# Patient Record
Sex: Male | Born: 1985 | Race: Black or African American | Hispanic: No
Health system: Southern US, Community
[De-identification: ages and names within clinical notes are randomized; demographics above are authoritative.]

---
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
## 2019-12-26 ENCOUNTER — Ambulatory Visit: Payer: Self-pay | Attending: Internal Medicine

## 2019-12-26 ENCOUNTER — Ambulatory Visit: Payer: 59

## 2019-12-26 DIAGNOSIS — Z23 Encounter for immunization: Secondary | ICD-10-CM

## 2019-12-26 NOTE — Progress Notes (Signed)
   Covid-19 Vaccination Clinic  Name:  Brandon Roberson    MRN: [id number] DOB: May 03, 1986  12/26/2019  [name] was observed post Covid-19 immunization for 15 minutes without incident. He was provided with Vaccine Information Sheet and instruction to access the V-Safe system.   [name] was instructed to call 911 with any severe reactions post vaccine: Marland Kitchen Difficulty breathing  . Swelling of face and throat  . A fast heartbeat  . A bad rash all over body  . Dizziness and weakness   Immunizations Administered    Name Date Dose VIS Date Route   Moderna COVID-19 Vaccine 12/26/2019 12:25 PM 0.5 mL 09/02/2019 Intramuscular   Manufacturer: Moderna   Lot: [id number]   NDC: 70623-762-83

## 2020-01-27 ENCOUNTER — Ambulatory Visit: Payer: Self-pay

## 2020-01-30 ENCOUNTER — Ambulatory Visit: Payer: Self-pay | Attending: Internal Medicine

## 2020-01-30 DIAGNOSIS — Z23 Encounter for immunization: Secondary | ICD-10-CM

## 2020-01-30 NOTE — Progress Notes (Signed)
   Covid-19 Vaccination Clinic  Name:  Brandon Roberson    MRN: [id number] DOB: 01-30-1986  01/30/2020  Brandon Roberson was observed post Covid-19 immunization for 15 minutes without incident. He was provided with Vaccine Information Sheet and instruction to access the V-Safe system.   Brandon Roberson was instructed to call 911 with any severe reactions post vaccine: Marland Kitchen Difficulty breathing  . Swelling of face and throat  . A fast heartbeat  . A bad rash all over body  . Dizziness and weakness   Immunizations Administered    Name Date Dose VIS Date Route   Moderna COVID-19 Vaccine 01/30/2020  9:19 AM 0.5 mL 09/2019 Intramuscular   Manufacturer: Moderna   Lot: [id number]   NDC: 00349-611-64

## 2023-10-25 ENCOUNTER — Ambulatory Visit
Admission: EM | Admit: 2023-10-25 | Discharge: 2023-10-25 | Disposition: A | Discharge status: Home / Self Care | Payer: 59 | Attending: Nurse Practitioner | Admitting: Nurse Practitioner

## 2023-10-25 DIAGNOSIS — R1013 Epigastric pain: Secondary | ICD-10-CM

## 2023-10-25 DIAGNOSIS — R112 Nausea with vomiting, unspecified: Secondary | ICD-10-CM

## 2023-10-25 MED ORDER — ONDANSETRON 4 MG PO TBDP: 4.0000 mg | ORAL_TABLET | Freq: Three times a day (TID) | ORAL | 0 refills | Status: AC | PRN

## 2023-10-25 NOTE — Discharge Instructions (Signed)
We are checking blood work today and will contact you with abnormal results tomorrow.  In the meantime, take the Zofran every 8 hours as needed for nausea/vomiting.  Make sure you are drinking plenty of water and Pedialyte.  Recommend soft easy to digest foods such as scrambled eggs, dry toast, applesauce, banana.  Follow-up with primary care provider if symptoms do not improve with treatment over the next few days.

## 2023-10-25 NOTE — ED Provider Notes (Signed)
[facility]    CSN: [id number] Arrival date & time: 10/25/23  0945      History   Chief Complaint No chief complaint on file.   HPI Brandon Roberson is a 38 y.o. male.   Patient presents today for approximately 1 month of abdominal upset including diarrhea, nausea, and vomiting.  Reports symptoms improved for 1 to 2 weeks and then recurred starting Monday.  He endorses nausea and 2 episodes of vomiting patient Monday night and Wednesday night.  He was nauseous and abdomen hurt prior to the vomiting, however denies any current nausea.  Denies diarrhea or blood in the stool.  Reports current abdominal pain level is a 3 out of 10 and described the pain as a cramping pressure.  No fevers, change in appetite, recent unexplained weight loss.  He does endorse "some" night sweats.  Reports he has been able to eat and drink normally, denies history of recent foreign travel, recent ingestion of suspicious drinking water, or known similar illness in context.  Reports his son goes to school but has not had a GI bug recently.  Denies recent antibiotic use.  He took some of his son previously prescribed antiemetic last night that did help with the nausea.  Patient has primary care fighter that he follows with regularly.    History reviewed. No pertinent past medical history.  There are no active problems to display for this patient.   History reviewed. No pertinent surgical history.     Home Medications    Prior to Admission medications   Medication Sig Start Date End Date Taking? Authorizing Provider  ondansetron (ZOFRAN-ODT) 4 MG disintegrating tablet Take 1 tablet (4 mg total) by mouth every 8 (eight) hours as needed for nausea or vomiting. 10/25/23  Yes Valentino Nose, NP    Family History History reviewed. No pertinent family history.  Social History Social History   Tobacco Use   Smoking status: Never   Smokeless tobacco: Never     Allergies   Patient  has no known allergies.   Review of Systems Review of Systems Per HPI  Physical Exam Triage Vital Signs ED Triage Vitals  Encounter Vitals Group     BP 10/25/23 1040 129/82     Systolic BP Percentile --      Diastolic BP Percentile --      Pulse Rate 10/25/23 1040 93     Resp 10/25/23 1040 18     Temp 10/25/23 1040 98.7 F (37.1 C)     Temp Source 10/25/23 1040 Oral     SpO2 10/25/23 1040 97 %     Weight --      Height --      Head Circumference --      Peak Flow --      Pain Score 10/25/23 1044 2     Pain Loc --      Pain Education --      Exclude from Growth Chart --    No data found.  Updated Vital Signs BP 129/82 (BP Location: Right Arm)   Pulse 93   Temp 98.7 F (37.1 C) (Oral)   Resp 18   SpO2 97%   Visual Acuity Right Eye Distance:   Left Eye Distance:   Bilateral Distance:    Right Eye Near:   Left Eye Near:    Bilateral Near:     Physical Exam Vitals and nursing note reviewed.  Constitutional:      General:  He is not in acute distress.    Appearance: Normal appearance. He is not toxic-appearing.  HENT:     Head: Normocephalic and atraumatic.     Mouth/Throat:     Mouth: Mucous membranes are moist.     Pharynx: Oropharynx is clear. No posterior oropharyngeal erythema.  Cardiovascular:     Rate and Rhythm: Normal rate and regular rhythm.  Pulmonary:     Effort: Pulmonary effort is normal. No respiratory distress.     Breath sounds: Normal breath sounds. No wheezing, rhonchi or rales.  Abdominal:     General: Abdomen is flat. Bowel sounds are normal. There is no distension.     Palpations: Abdomen is soft.     Tenderness: There is no abdominal tenderness. There is no right CVA tenderness, left CVA tenderness, guarding or rebound.  Musculoskeletal:     Cervical back: Normal range of motion.  Lymphadenopathy:     Cervical: No cervical adenopathy.  Skin:    General: Skin is warm and dry.     Capillary Refill: Capillary refill takes less than  2 seconds.     Coloration: Skin is not jaundiced or pale.     Findings: No erythema.  Neurological:     Mental Status: He is alert.     Motor: No weakness.     Gait: Gait normal.  Psychiatric:        Behavior: Behavior is cooperative.      UC Treatments / Results  Labs (all labs ordered are listed, but only abnormal results are displayed) Labs Reviewed  CBC WITH DIFFERENTIAL/PLATELET  COMPREHENSIVE METABOLIC PANEL  LIPASE    EKG   Radiology No results found.  Procedures Procedures (including critical care time)  Medications Ordered in UC Medications - No data to display  Initial Impression / Assessment and Plan / UC Course  I have reviewed the triage vital signs and the nursing notes.  Pertinent labs & imaging results that were available during my care of the patient were reviewed by me and considered in my medical decision making (see chart for details).   Patient is well-appearing, normotensive, afebrile, not tachycardic, not tachypneic, oxygenating well on room air.    1. Nausea and vomiting, unspecified vomiting type 2. Epigastric pain Overall, vitals and exam are reassuring today Given recurrence, will obtain blood work to evaluate for metabolic abnormality or acute infectious process Start Zofran to help prevent vomiting, orally rehydrate and avoid foods that may be triggers Strict ER precautions discussed with patient Work excuse advised Recommended close follow-up with PCP if symptoms do not fully improve with treatment  The patient was given the opportunity to ask questions.  All questions answered to their satisfaction.  The patient is in agreement to this plan.    Final Clinical Impressions(s) / UC Diagnoses   Final diagnoses:  Nausea and vomiting, unspecified vomiting type  Epigastric pain     Discharge Instructions      We are checking blood work today and will contact you with abnormal results tomorrow.  In the meantime, take the Zofran  every 8 hours as needed for nausea/vomiting.  Make sure you are drinking plenty of water and Pedialyte.  Recommend soft easy to digest foods such as scrambled eggs, dry toast, applesauce, banana.  Follow-up with primary care provider if symptoms do not improve with treatment over the next few days.     ED Prescriptions     Medication Sig Dispense Auth. Provider   ondansetron (ZOFRAN-ODT) 4  MG disintegrating tablet Take 1 tablet (4 mg total) by mouth every 8 (eight) hours as needed for nausea or vomiting. 20 tablet Valentino Nose, NP      PDMP not reviewed this encounter.   Valentino Nose, NP 10/25/23 1151

## 2023-10-25 NOTE — ED Triage Notes (Signed)
Pt reports, stomach flu sx's, has been having nausea and vomiting since the first of the year states he was feeling better then yesterday sx's seemed to return.

## 2023-10-26 LAB — CBC WITH DIFFERENTIAL/PLATELET
Basophils Absolute: 0 10*3/uL (ref 0.0–0.2)
Basos: 1 %
EOS (ABSOLUTE): 0.1 10*3/uL (ref 0.0–0.4)
Eos: 1 %
Hematocrit: 44.3 % (ref 37.5–51.0)
Hemoglobin: 14.7 g/dL (ref 13.0–17.7)
Immature Grans (Abs): 0 10*3/uL (ref 0.0–0.1)
Immature Granulocytes: 0 %
Lymphocytes Absolute: 0.8 10*3/uL (ref 0.7–3.1)
Lymphs: 16 %
MCH: 27.9 pg (ref 26.6–33.0)
MCHC: 33.2 g/dL (ref 31.5–35.7)
MCV: 84 fL (ref 79–97)
Monocytes Absolute: 0.4 10*3/uL (ref 0.1–0.9)
Monocytes: 9 %
Neutrophils Absolute: 3.6 10*3/uL (ref 1.4–7.0)
Neutrophils: 73 %
Platelets: 241 10*3/uL (ref 150–450)
RBC: 5.26 x10E6/uL (ref 4.14–5.80)
RDW: 13.1 % (ref 11.6–15.4)
WBC: 4.9 10*3/uL (ref 3.4–10.8)

## 2023-10-26 LAB — LIPASE: Lipase: 14 U/L (ref 13–78)

## 2023-10-26 LAB — COMPREHENSIVE METABOLIC PANEL
ALT: 17 [IU]/L (ref 0–44)
AST: 19 [IU]/L (ref 0–40)
Albumin: 4.3 g/dL (ref 4.1–5.1)
Alkaline Phosphatase: 67 [IU]/L (ref 44–121)
BUN/Creatinine Ratio: 11 (ref 9–20)
BUN: 15 mg/dL (ref 6–20)
Bilirubin Total: 0.4 mg/dL (ref 0.0–1.2)
CO2: 22 mmol/L (ref 20–29)
Calcium: 9.4 mg/dL (ref 8.7–10.2)
Chloride: 105 mmol/L (ref 96–106)
Creatinine, Ser: 1.32 mg/dL — ABNORMAL HIGH (ref 0.76–1.27)
Globulin, Total: 3 g/dL (ref 1.5–4.5)
Glucose: 80 mg/dL (ref 70–99)
Potassium: 4 mmol/L (ref 3.5–5.2)
Sodium: 143 mmol/L (ref 134–144)
Total Protein: 7.3 g/dL (ref 6.0–8.5)
eGFR: 72 mL/min/{1.73_m2} (ref >59)

## 2024-11-06 ENCOUNTER — Ambulatory Visit
Admission: EM | Admit: 2024-11-06 | Discharge: 2024-11-06 | Disposition: A | Discharge status: Home / Self Care | Source: Home / Self Care

## 2024-11-06 DIAGNOSIS — T23279A Burn of second degree of unspecified wrist, initial encounter: Secondary | ICD-10-CM

## 2024-11-06 DIAGNOSIS — Z23 Encounter for immunization: Secondary | ICD-10-CM

## 2024-11-06 MED ORDER — TETANUS-DIPHTH-ACELL PERTUSSIS 5-2-15.5 LF-MCG/0.5 IM SUSP
0.5000 mL | Freq: Once | INTRAMUSCULAR | Status: AC
  Administered 2024-11-06: 0.5 mL via INTRAMUSCULAR

## 2024-11-06 MED ORDER — CEPHALEXIN 500 MG PO CAPS: 500.0000 mg | ORAL_CAPSULE | Freq: Four times a day (QID) | ORAL | 0 refills | Status: AC

## 2024-11-06 MED ORDER — SILVER SULFADIAZINE 1 % EX CREA: TOPICAL_CREAM | Freq: Once | CUTANEOUS | Status: AC

## 2024-11-06 MED ORDER — SILVER SULFADIAZINE 1 % EX CREA: 1.0000 | TOPICAL_CREAM | Freq: Every day | CUTANEOUS | 0 refills | Status: AC

## 2024-11-06 NOTE — ED Triage Notes (Signed)
 Pt reports burn to the right wrist, states he was fixing the lid on his coffee cup and it spilled onto his right wrist this occurred at 1:00 am.

## 2024-11-06 NOTE — Discharge Instructions (Signed)
 Start taking cephalexin  4 times daily for 7 days for infection prevention related to burn. Apply Silvadene  cream twice daily as needed dressing changes for additional infection prevention. Clean gently with soap and water. You can follow-up with the wound care center for further evaluation and management of this. Otherwise follow-up with your primary care provider or return here as needed.

## 2024-11-06 NOTE — ED Provider Notes (Signed)
 " [facility]    CSN: [id number] Arrival date & time: 11/06/24  9182      History   Chief Complaint No chief complaint on file.   HPI Brandon Roberson is a 39 y.o. male.   Patient presents with a burn to his right wrist that occurred last night.  Patient reports that he was fixing the lid on his coffee around 1 AM last night when he accidentally spilled the hot coffee on his right wrist.  Patient reports that the burn has not spread and is stayed localized to the area of contact.  Patient reports there are no blisters, but the blisters have not ruptured.  Patient reports he believes his last Tdap was 5 years ago.  The history is provided by the patient and medical records.    History reviewed. No pertinent past medical history.  There are no active problems to display for this patient.   History reviewed. No pertinent surgical history.     Home Medications    Prior to Admission medications  Medication Sig Start Date End Date Taking? Authorizing Provider  cephALEXin  (KEFLEX ) 500 MG capsule Take 1 capsule (500 mg total) by mouth 4 (four) times daily for 7 days. 11/06/24 11/13/24 Yes Jaykwon Morones A, NP  silver  sulfADIAZINE  (SILVADENE ) 1 % cream Apply 1 Application topically daily. 11/06/24  Yes [name] Flaming A, NP  ondansetron  (ZOFRAN -ODT) 4 MG disintegrating tablet Take 1 tablet (4 mg total) by mouth every 8 (eight) hours as needed for nausea or vomiting. 10/25/23   Chandra Harlene LABOR, NP    Family History History reviewed. No pertinent family history.  Social History Social History[1]   Allergies   Patient has no known allergies.   Review of Systems Review of Systems  Per HPI  Physical Exam Triage Vital Signs ED Triage Vitals [11/06/24 0829]  Encounter Vitals Group     BP 117/74     Girls Systolic BP Percentile      Girls Diastolic BP Percentile      Boys Systolic BP Percentile      Boys Diastolic BP Percentile      Pulse Rate 79      Resp 20     Temp 98.4 F (36.9 C)     Temp Source Oral     SpO2 96 %     Weight      Height      Head Circumference      Peak Flow      Pain Score 4     Pain Loc      Pain Education      Exclude from Growth Chart    No data found.  Updated Vital Signs BP 117/74 (BP Location: Right Arm)   Pulse 79   Temp 98.4 F (36.9 C) (Oral)   Resp 20   SpO2 96%   Visual Acuity Right Eye Distance:   Left Eye Distance:   Bilateral Distance:    Right Eye Near:   Left Eye Near:    Bilateral Near:     Physical Exam Vitals and nursing note reviewed.  Constitutional:      General: He is awake. He is not in acute distress.    Appearance: Normal appearance. He is well-developed and well-groomed. He is not ill-appearing.  Skin:    General: Skin is warm and dry.     Findings: Burn present.     Comments: Superficial partial-thickness burn with blisters present noted to right lateral  wrist with some surrounding erythema and discoloration.  Neurological:     General: No focal deficit present.     Mental Status: He is alert and oriented to person, place, and time. Mental status is at baseline.  Psychiatric:        Behavior: Behavior is cooperative.         UC Treatments / Results  Labs (all labs ordered are listed, but only abnormal results are displayed) Labs Reviewed - No data to display  EKG   Radiology No results found.  Procedures Procedures (including critical care time)  Medications Ordered in UC Medications  Tdap (ADACEL) injection 0.5 mL (has no administration in time range)  silver  sulfADIAZINE  (SILVADENE ) 1 % cream (has no administration in time range)    Initial Impression / Assessment and Plan / UC Course  I have reviewed the triage vital signs and the nursing notes.  Pertinent labs & imaging results that were available during my care of the patient were reviewed by me and considered in my medical decision making (see chart for details).     Patient is  overall well-appearing.  Vitals are stable.  Updated Tdap in clinic.  Provided basic wound care and applied Silvadene  in clinic.  Prescribed cephalexin  and Silvadene  for infection prevention.  Discussed proper burn care.  Provided patient information for wound care if needed.  Discussed follow-up and return precautions. Final Clinical Impressions(s) / UC Diagnoses   Final diagnoses:  Superficial partial thickness burn of wrist     Discharge Instructions      Start taking cephalexin  4 times daily for 7 days for infection prevention related to burn. Apply Silvadene  cream twice daily as needed dressing changes for additional infection prevention. Clean gently with soap and water. You can follow-up with the wound care center for further evaluation and management of this. Otherwise follow-up with your primary care provider or return here as needed.   ED Prescriptions     Medication Sig Dispense Auth. Provider   cephALEXin  (KEFLEX ) 500 MG capsule Take 1 capsule (500 mg total) by mouth 4 (four) times daily for 7 days. 28 capsule [name], Larry Alcock A, NP   silver  sulfADIAZINE  (SILVADENE ) 1 % cream Apply 1 Application topically daily. 50 g [name] Flaming A, NP      PDMP not reviewed this encounter.    [1]  Social History Tobacco Use   Smoking status: Never   Smokeless tobacco: Never     [name] Flaming LABOR, NP 11/06/24 0848  "
